# Patient Record
Sex: Male | Born: 1980 | Race: Black or African American | Hispanic: No | Smoking: Current every day smoker
Health system: Southern US, Community
[De-identification: ages and names within clinical notes are randomized; demographics above are authoritative.]

## PROBLEM LIST (undated history)

## (undated) DIAGNOSIS — N200 Calculus of kidney: Secondary | ICD-10-CM

## (undated) DIAGNOSIS — D573 Sickle-cell trait: Secondary | ICD-10-CM

## (undated) HISTORY — DX: Calculus of kidney: N20.0

---

## 2010-03-21 ENCOUNTER — Encounter: Admission: RE | Admit: 2010-03-21 | Discharge: 2010-03-21 | Payer: Self-pay | Admitting: Family Medicine

## 2011-06-05 ENCOUNTER — Ambulatory Visit
Admission: RE | Admit: 2011-06-05 | Discharge: 2011-06-05 | Disposition: A | Discharge status: Home / Self Care | Payer: Self-pay | Source: Ambulatory Visit | Attending: Family Medicine | Admitting: Family Medicine

## 2011-06-05 ENCOUNTER — Other Ambulatory Visit: Payer: Self-pay | Admitting: Family Medicine

## 2011-06-05 DIAGNOSIS — M545 Low back pain: Secondary | ICD-10-CM

## 2013-08-10 ENCOUNTER — Ambulatory Visit (INDEPENDENT_AMBULATORY_CARE_PROVIDER_SITE_OTHER): Payer: BC Managed Care – PPO | Admitting: General Surgery

## 2013-08-16 ENCOUNTER — Encounter (INDEPENDENT_AMBULATORY_CARE_PROVIDER_SITE_OTHER): Payer: Self-pay | Admitting: General Surgery

## 2016-07-12 ENCOUNTER — Other Ambulatory Visit: Payer: Self-pay | Admitting: Orthopedic Surgery

## 2016-07-12 DIAGNOSIS — M419 Scoliosis, unspecified: Secondary | ICD-10-CM

## 2016-07-12 DIAGNOSIS — M546 Pain in thoracic spine: Secondary | ICD-10-CM

## 2016-07-12 DIAGNOSIS — M545 Low back pain: Secondary | ICD-10-CM

## 2019-07-11 DIAGNOSIS — H6121 Impacted cerumen, right ear: Secondary | ICD-10-CM | POA: Diagnosis not present

## 2019-07-11 DIAGNOSIS — Z13220 Encounter for screening for lipoid disorders: Secondary | ICD-10-CM | POA: Diagnosis not present

## 2019-07-11 DIAGNOSIS — Z125 Encounter for screening for malignant neoplasm of prostate: Secondary | ICD-10-CM | POA: Diagnosis not present

## 2019-07-11 DIAGNOSIS — Z0000 Encounter for general adult medical examination without abnormal findings: Secondary | ICD-10-CM | POA: Diagnosis not present

## 2019-09-22 ENCOUNTER — Other Ambulatory Visit: Payer: Self-pay

## 2019-09-22 ENCOUNTER — Emergency Department (HOSPITAL_COMMUNITY)
Admission: EM | Admit: 2019-09-22 | Discharge: 2019-09-23 | Disposition: A | Discharge status: Home / Self Care | Payer: BC Managed Care – PPO | Attending: Emergency Medicine | Admitting: Emergency Medicine

## 2019-09-22 ENCOUNTER — Emergency Department (HOSPITAL_COMMUNITY): Payer: BC Managed Care – PPO

## 2019-09-22 ENCOUNTER — Encounter (HOSPITAL_COMMUNITY): Payer: Self-pay

## 2019-09-22 DIAGNOSIS — Z79899 Other long term (current) drug therapy: Secondary | ICD-10-CM | POA: Insufficient documentation

## 2019-09-22 DIAGNOSIS — R319 Hematuria, unspecified: Secondary | ICD-10-CM | POA: Diagnosis not present

## 2019-09-22 DIAGNOSIS — N23 Unspecified renal colic: Secondary | ICD-10-CM | POA: Diagnosis not present

## 2019-09-22 DIAGNOSIS — I10 Essential (primary) hypertension: Secondary | ICD-10-CM | POA: Diagnosis not present

## 2019-09-22 DIAGNOSIS — R1031 Right lower quadrant pain: Secondary | ICD-10-CM | POA: Diagnosis not present

## 2019-09-22 DIAGNOSIS — F17290 Nicotine dependence, other tobacco product, uncomplicated: Secondary | ICD-10-CM | POA: Diagnosis not present

## 2019-09-22 DIAGNOSIS — R52 Pain, unspecified: Secondary | ICD-10-CM | POA: Diagnosis not present

## 2019-09-22 DIAGNOSIS — R109 Unspecified abdominal pain: Secondary | ICD-10-CM | POA: Diagnosis not present

## 2019-09-22 DIAGNOSIS — R1084 Generalized abdominal pain: Secondary | ICD-10-CM | POA: Diagnosis not present

## 2019-09-22 DIAGNOSIS — I959 Hypotension, unspecified: Secondary | ICD-10-CM | POA: Diagnosis not present

## 2019-09-22 HISTORY — DX: Sickle-cell trait: D57.3

## 2019-09-22 LAB — CBC WITH DIFFERENTIAL/PLATELET
Abs Immature Granulocytes: 0.04 10*3/uL (ref 0.00–0.07)
Basophils Absolute: 0.1 10*3/uL (ref 0.0–0.1)
Basophils Relative: 0 %
Eosinophils Absolute: 0.1 10*3/uL (ref 0.0–0.5)
Eosinophils Relative: 0 %
HCT: 44.9 % (ref 39.0–52.0)
Hemoglobin: 15.1 g/dL (ref 13.0–17.0)
Immature Granulocytes: 0 %
Lymphocytes Relative: 11 %
Lymphs Abs: 1.5 10*3/uL (ref 0.7–4.0)
MCH: 30.3 pg (ref 26.0–34.0)
MCHC: 33.6 g/dL (ref 30.0–36.0)
MCV: 90 fL (ref 80.0–100.0)
Monocytes Absolute: 0.9 10*3/uL (ref 0.1–1.0)
Monocytes Relative: 6 %
Neutro Abs: 11.6 10*3/uL — ABNORMAL HIGH (ref 1.7–7.7)
Neutrophils Relative %: 83 %
Platelets: 267 10*3/uL (ref 150–400)
RBC: 4.99 MIL/uL (ref 4.22–5.81)
RDW: 12.9 % (ref 11.5–15.5)
WBC: 14.1 10*3/uL — ABNORMAL HIGH (ref 4.0–10.5)
nRBC: 0 % (ref 0.0–0.2)

## 2019-09-22 LAB — URINALYSIS, ROUTINE W REFLEX MICROSCOPIC
Bilirubin Urine: NEGATIVE
Glucose, UA: NEGATIVE mg/dL
Ketones, ur: 5 mg/dL — AB
Leukocytes,Ua: NEGATIVE
Nitrite: NEGATIVE
Protein, ur: 30 mg/dL — AB
RBC / HPF: >50 RBC/hpf — ABNORMAL HIGH (ref 0–5)
Specific Gravity, Urine: 1.014 (ref 1.005–1.030)
pH: 6 (ref 5.0–8.0)

## 2019-09-22 LAB — COMPREHENSIVE METABOLIC PANEL
ALT: 23 U/L (ref 0–44)
AST: 23 U/L (ref 15–41)
Albumin: 4.2 g/dL (ref 3.5–5.0)
Alkaline Phosphatase: 79 U/L (ref 38–126)
Anion gap: 7 (ref 5–15)
BUN: 12 mg/dL (ref 6–20)
CO2: 26 mmol/L (ref 22–32)
Calcium: 8.9 mg/dL (ref 8.9–10.3)
Chloride: 105 mmol/L (ref 98–111)
Creatinine, Ser: 1.09 mg/dL (ref 0.61–1.24)
GFR calc Af Amer: >60 mL/min (ref >60)
GFR calc non Af Amer: >60 mL/min (ref >60)
Glucose, Bld: 98 mg/dL (ref 70–99)
Potassium: 4 mmol/L (ref 3.5–5.1)
Sodium: 138 mmol/L (ref 135–145)
Total Bilirubin: 0.4 mg/dL (ref 0.3–1.2)
Total Protein: 7.2 g/dL (ref 6.5–8.1)

## 2019-09-22 NOTE — ED Triage Notes (Signed)
Pt from home with c/o of sudden and severe RL abdominal pain that caused him to vomit.  Pt stated 2 hours earlier he urinated what looked like a blood clot.  Later he tried to urinate, was only able to urinate some, but still had an urge to urinate.  Pt given 200 mcg fentanyl. 18 gauge L AC

## 2019-09-22 NOTE — ED Provider Notes (Signed)
[facility]-EMERGENCY DEPT Provider Note   CSN: [id number] Arrival date & time: 09/22/19  1832     History   Chief Complaint Chief Complaint  Patient presents with   Abdominal Pain   Emesis    HPI Ronald MartyrMichael Mcdonnell Jr. is a 38 y.o. male.     Patient with no past abdominal surgical history presents the emergency department with acute onset of right lower quadrant pain with associated diaphoresis and vomiting starting this afternoon.  Patient denies any chest pain or shortness of breath.  He passed blood in his urine and also noted small clots.  No bowel changes.  No testicular pain or tenderness.  No back pain.  He has never had kidney stones before but has a family history.  He was given fentanyl in route by EMS who transported the patient to the hospital.  No other treatments prior to arrival.  Onset of symptoms acute.  Course is improving.  Pain is now currently 3/10.  Nothing makes symptoms worse.     Past Medical History:  Diagnosis Date   Sickle cell trait (HCC)     There are no active problems to display for this patient.   History reviewed. No pertinent surgical history.      Home Medications    Prior to Admission medications   Medication Sig Start Date End Date Taking? Authorizing Provider  acetaminophen (TYLENOL) 325 MG tablet Take 650 mg by mouth daily as needed for moderate pain or headache.   Yes [provider]  Misc Natural Products (NF FORMULAS TESTOSTERONE) CAPS Take 1 capsule by mouth daily.   Yes [provider]  naproxen (NAPROSYN) 500 MG tablet Take 1 tablet (500 mg total) by mouth 2 (two) times daily. 09/23/19   Renne CriglerGeiple, Dalayza Zambrana, PA-C  oxyCODONE-acetaminophen (PERCOCET/ROXICET) 5-325 MG tablet Take 1 tablet by mouth every 6 (six) hours as needed for severe pain. 09/23/19   Renne CriglerGeiple, Yahmir Sokolov, PA-C    Family History History reviewed. No pertinent family history.  Social History Social History   Tobacco Use    Smoking status: Current Every Day Smoker    Types: Cigars   Tobacco comment: 1 cigar  Substance Use Topics   Alcohol use: Yes    Comment: 1x/month socially   Drug use: Not on file     Allergies   Patient has no known allergies.   Review of Systems Review of Systems  Constitutional: Positive for diaphoresis. Negative for fever.  HENT: Negative for rhinorrhea and sore throat.   Eyes: Negative for redness.  Respiratory: Negative for cough and shortness of breath.   Cardiovascular: Negative for chest pain.  Gastrointestinal: Positive for abdominal pain, nausea and vomiting. Negative for diarrhea.  Genitourinary: Positive for hematuria. Negative for dysuria, scrotal swelling and testicular pain.  Musculoskeletal: Negative for myalgias.  Skin: Negative for rash.  Neurological: Negative for headaches.     Physical Exam Updated Vital Signs BP 117/88    Pulse 60    Temp 97.6 F (36.4 C) (Oral)    Resp 16    Ht 5\' 11"  (1.803 m)    Wt 99.8 kg    SpO2 100%    BMI 30.68 kg/m   Physical Exam Vitals signs and nursing note reviewed.  Constitutional:      Appearance: He is well-developed.  HENT:     Head: Normocephalic and atraumatic.  Eyes:     General:        Right eye: No discharge.  Left eye: No discharge.     Conjunctiva/sclera: Conjunctivae normal.  Neck:     Musculoskeletal: Normal range of motion and neck supple.  Cardiovascular:     Rate and Rhythm: Normal rate and regular rhythm.     Heart sounds: Normal heart sounds.  Pulmonary:     Effort: Pulmonary effort is normal.     Breath sounds: Normal breath sounds.  Abdominal:     Palpations: Abdomen is soft.     Tenderness: There is abdominal tenderness (Mild) in the right lower quadrant. There is no guarding or rebound.     Hernia: No hernia is present.  Genitourinary:    Scrotum/Testes: Normal.        Right: Mass, tenderness or swelling not present.        Left: Mass, tenderness or swelling not present.   Skin:    General: Skin is warm and dry.  Neurological:     Mental Status: He is alert.      ED Treatments / Results  Labs (all labs ordered are listed, but only abnormal results are displayed) Labs Reviewed  CBC WITH DIFFERENTIAL/PLATELET - Abnormal; Notable for the following components:      Result Value   WBC 14.1 (*)    Neutro Abs 11.6 (*)    All other components within normal limits  URINALYSIS, ROUTINE W REFLEX MICROSCOPIC - Abnormal; Notable for the following components:   Color, Urine AMBER (*)    Hgb urine dipstick LARGE (*)    Ketones, ur 5 (*)    Protein, ur 30 (*)    RBC / HPF >50 (*)    Bacteria, UA RARE (*)    All other components within normal limits  COMPREHENSIVE METABOLIC PANEL    EKG None  Radiology Ct Renal Stone Study  Result Date: 09/22/2019 CLINICAL DATA:  38 year old male with right abdominal pain. EXAM: CT ABDOMEN AND PELVIS WITHOUT CONTRAST TECHNIQUE: Multidetector CT imaging of the abdomen and pelvis was performed following the standard protocol without IV contrast. COMPARISON:  None. FINDINGS: Evaluation of this exam is limited in the absence of intravenous contrast. Lower chest: 2 adjacent nodules in the left lower lobe measure up to 5 mm (series 4 image 5). The visualized lung bases are otherwise clear. No intra-abdominal free air or free fluid. Hepatobiliary: No focal liver abnormality is seen. No gallstones, gallbladder wall thickening, or biliary dilatation. Pancreas: Unremarkable. No pancreatic ductal dilatation or surrounding inflammatory changes. Spleen: Normal in size without focal abnormality. Adrenals/Urinary Tract: The adrenal glands are unremarkable. There is no hydronephrosis or nephrolithiasis on either side. There is mild fullness of the right renal pelvis and proximal ureter with adjacent stranding. Findings may be related to a recently passed right renal calculus or right-sided pyelonephritis. Correlation with urinalysis recommended.  The urinary bladder is unremarkable. Stomach/Bowel: There is no bowel obstruction or active inflammation. The appendix is normal. Vascular/Lymphatic: The abdominal aorta and IVC are grossly unremarkable on this noncontrast CT. No portal venous gas. There is no adenopathy. Reproductive: The prostate and seminal vesicles are grossly unremarkable. No pelvic mass. Other: Small fat containing umbilical hernia. Musculoskeletal: Severe scoliosis. No acute osseous pathology. IMPRESSION: 1. Mild fullness of the right renal pelvis and proximal ureter with adjacent stranding. Findings may be related to a recently passed right renal calculus or right-sided pyelonephritis. Correlation with urinalysis recommended. No hydronephrosis or nephrolithiasis. 2. No bowel obstruction or active inflammation. Normal appendix. Electronically Signed   By: Anner Crete M.D.   On:  09/22/2019 20:12    Procedures Procedures (including critical care time)  Medications Ordered in ED Medications - No data to display   Initial Impression / Assessment and Plan / ED Course  I have reviewed the triage vital signs and the nursing notes.  Pertinent labs & imaging results that were available during my care of the patient were reviewed by me and considered in my medical decision making (see chart for details).        Patient seen and examined. Work-up initiated. Medications ordered.  Suspect patient has ureteral colic.  Will obtain CT renal to confirm stone as patient has never had one before.  Pain is currently controlled.  Vital signs reviewed and are as follows: BP 117/88    Pulse 60    Temp 97.6 F (36.4 C) (Oral)    Resp 16    Ht 5\' 11"  (1.803 m)    Wt 99.8 kg    SpO2 100%    BMI 30.68 kg/m   12:15 AM CT performed and reviewed.  Note definitive stone noted however there were changes on the right side consistent with recently passed stone.  Pain is remained controlled during ED stay.  UA is clear without signs of infection  but does show blood.  Normal kidney function.  At this time, patient is stable for discharged home.  Patient will be provided with strainer and given prescription for oxycodone and naproxen.  Urology follow-up given.  Patient counseled on kidney stone treatment. Urged patient to strain urine and save any stones. Urged urology follow-up and return to [facility] with any complications. Counseled patient to maintain good fluid intake.   Patient counseled on use of narcotic pain medications. Counseled not to combine these medications with others containing tylenol. Urged not to drink alcohol, drive, or perform any other activities that requires focus while taking these medications. The patient verbalizes understanding and agrees with the plan.   Final Clinical Impressions(s) / ED Diagnoses   Final diagnoses:  Ureteral colic  Hematuria, unspecified type   Patient with right-sided flank pain, hematuria, acute onset consistent with ureteral colic.  Work-up as above.  Labs are reassuring.  Pain is controlled.  ED Discharge Orders         Ordered    oxyCODONE-acetaminophen (PERCOCET/ROXICET) 5-325 MG tablet  Every 6 hours PRN,   Status:  Discontinued     09/23/19 0006    naproxen (NAPROSYN) 500 MG tablet  2 times daily     09/23/19 0006    oxyCODONE-acetaminophen (PERCOCET/ROXICET) 5-325 MG tablet  Every 6 hours PRN     09/23/19 0007           11/23/19, PA-C 09/23/19 0016    11/23/19, MD 09/23/19 1609

## 2019-09-23 MED ORDER — NAPROXEN 500 MG PO TABS: 500.0000 mg | ORAL_TABLET | Freq: Two times a day (BID) | ORAL | 0 refills | Status: DC

## 2019-09-23 MED ORDER — OXYCODONE-ACETAMINOPHEN 5-325 MG PO TABS: 1.0000 | ORAL_TABLET | Freq: Four times a day (QID) | ORAL | 0 refills | Status: DC | PRN

## 2019-09-23 NOTE — Discharge Instructions (Signed)
Please read and follow all provided instructions.  Your diagnoses today include:  1. Ureteral colic   2. Hematuria, unspecified type     Tests performed today include:  Urine test that showed blood in your urine and no infection  CT scan which did not show a definite kidney stone but shows signs of a recently passed kidney stone  Blood test that showed normal kidney function  Vital signs. See below for your results today.   Medications prescribed:   Percocet (oxycodone/acetaminophen) - narcotic pain medication  DO NOT drive or perform any activities that require you to be awake and alert because this medicine can make you drowsy. BE VERY CAREFUL not to take multiple medicines containing Tylenol (also called acetaminophen). Doing so can lead to an overdose which can damage your liver and cause liver failure and possibly death.   Naproxen - anti-inflammatory pain medication  Do not exceed 500mg  naproxen every 12 hours, take with food  You have been prescribed an anti-inflammatory medication or NSAID. Take with food. Take smallest effective dose for the shortest duration needed for your pain. Stop taking if you experience stomach pain or vomiting.   Take any prescribed medications only as directed.  Home care instructions:  Follow any educational materials contained in this packet.  Please double your fluid intake for the next several days. Strain your urine and save any stones that may pass.   BE VERY CAREFUL not to take multiple medicines containing Tylenol (also called acetaminophen). Doing so can lead to an overdose which can damage your liver and cause liver failure and possibly death.   Follow-up instructions: Please follow-up with your urologist or the urologist referral (provided on front page) in the next 1 week for further evaluation of your symptoms.  If you need to return to the Emergency Department, go to [facility] and not [facility]. The  urologists are located at [facility] and can better care for you at this location.  Return instructions:   Please return to the Emergency Department if you experience worsening symptoms.  Please return if you develop fever or uncontrolled pain or vomiting.  Please return if you have any other emergent concerns.  Additional Information:  Your vital signs today were: BP 113/77 (BP Location: Right Arm)    Pulse 60    Temp 97.8 F (36.6 C) (Oral)    Resp 16    Ht 5\' 11"  (1.803 m)    Wt 99.8 kg    SpO2 100%    BMI 30.68 kg/m  If your blood pressure (BP) was elevated above 135/85 this visit, please have this repeated by your doctor within one month. --------------

## 2019-10-03 IMAGING — CT CT RENAL STONE PROTOCOL
2 of 4 series · 16 of 46 positions shown, 18 images · non-contrast
Comparison: None.

CLINICAL DATA: 38-year-old male with right abdominal pain.

EXAM:
CT ABDOMEN AND PELVIS WITHOUT CONTRAST
TECHNIQUE: Multidetector CT imaging of the abdomen and pelvis was performed
following the standard protocol without IV contrast.

[Series 2: axial st · axial · 0.75mm/px · z∈[-344,+76]mm · 13 of 94 slices shown, 15 images]
[im 5/94  soft-tissue]
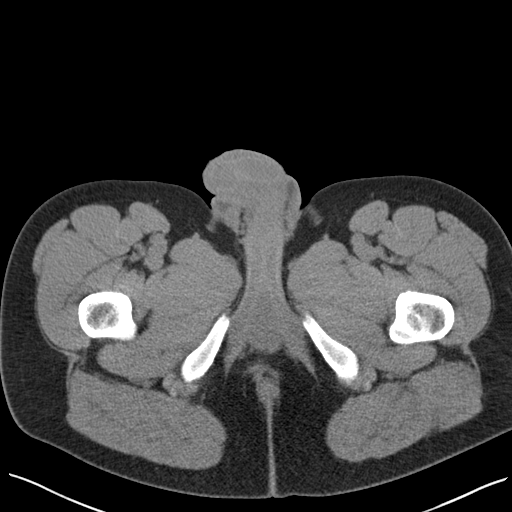
[im 5/94  bone]
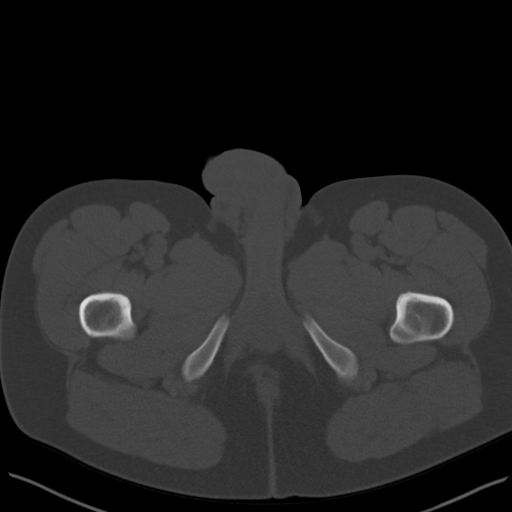
[im 15/94  soft-tissue]
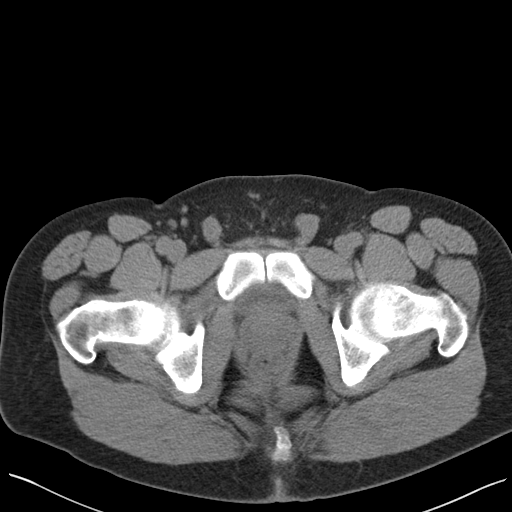
[im 20/94  soft-tissue]
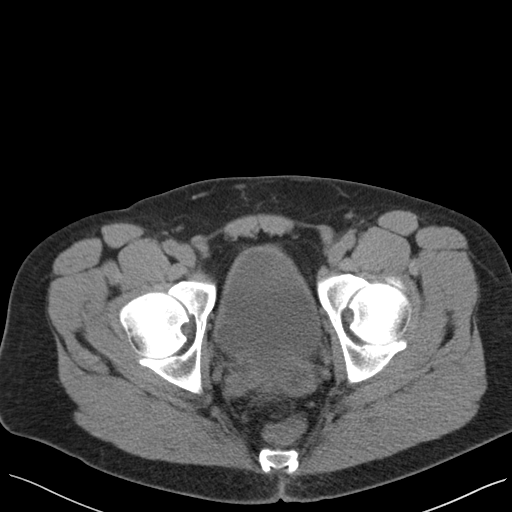
[im 25/94  soft-tissue]
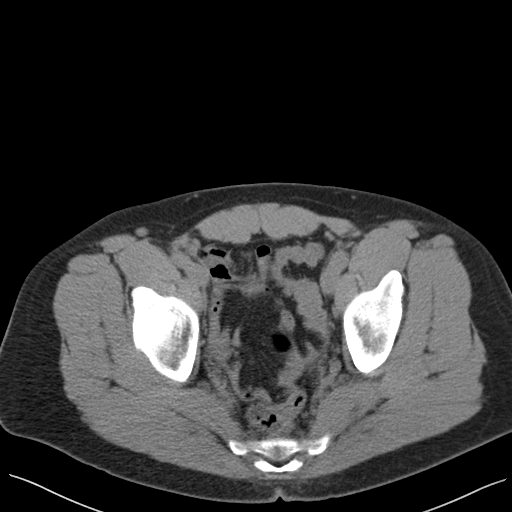
[im 35/94  soft-tissue]
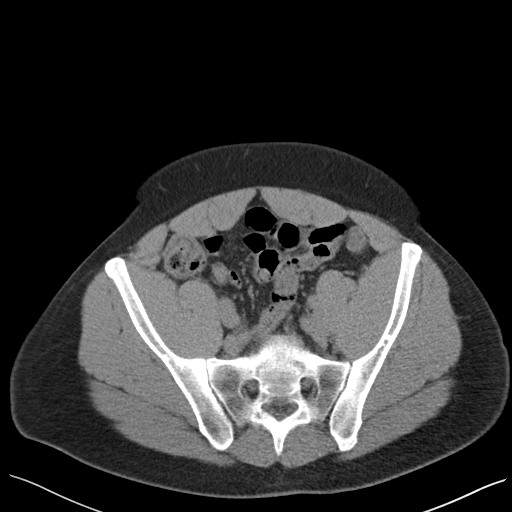
[im 40/94  soft-tissue]
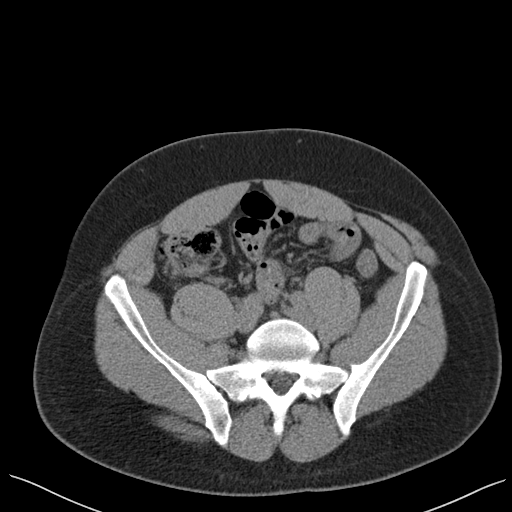
[im 49/94  soft-tissue]
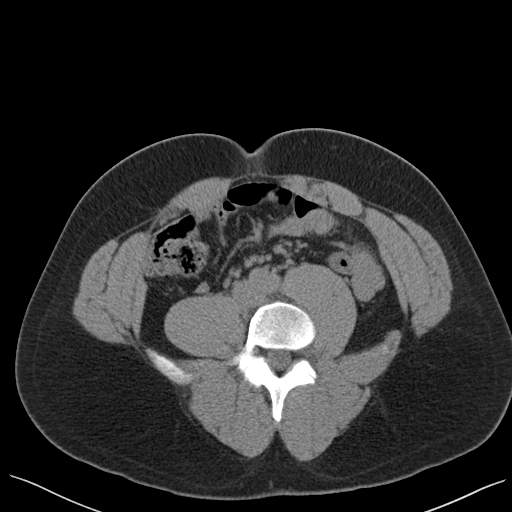
[im 54/94  soft-tissue]
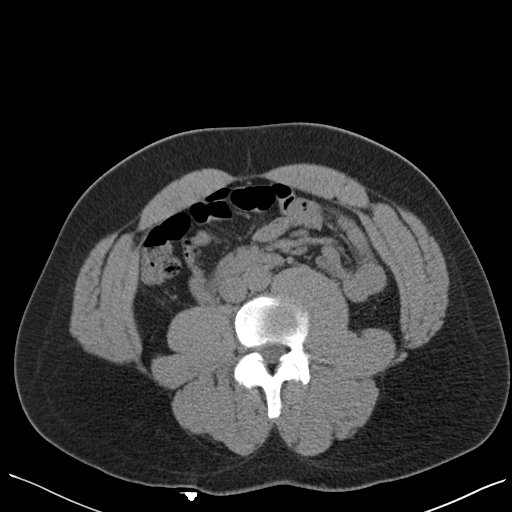
[im 59/94  soft-tissue]
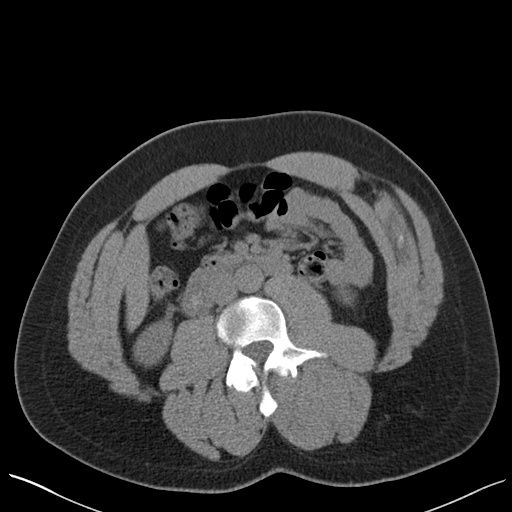
[im 59/94  bone]
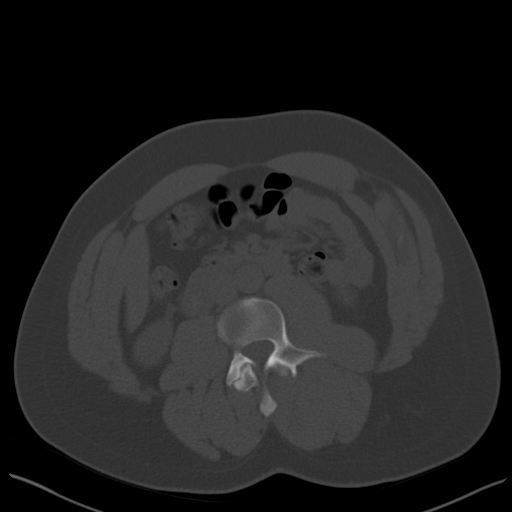
[im 69/94  soft-tissue]
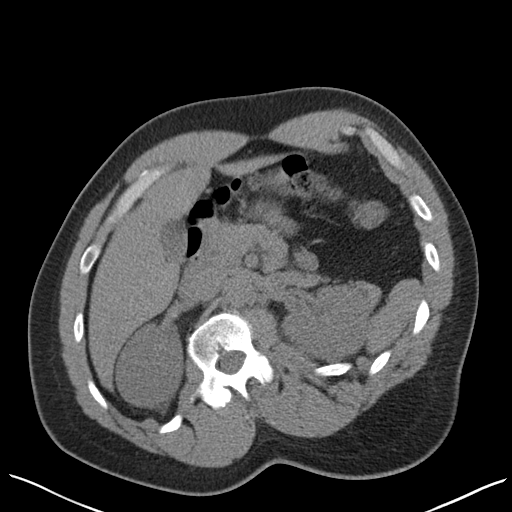
[im 74/94  soft-tissue]
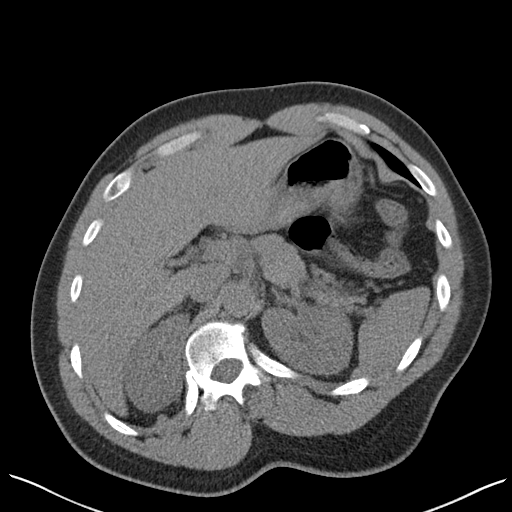
[im 79/94  soft-tissue]
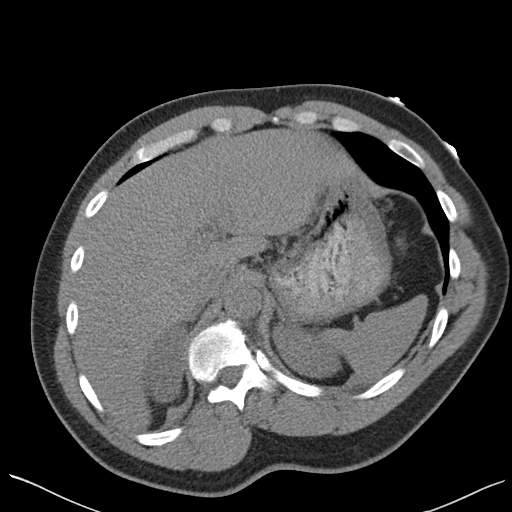
[im 89/94  soft-tissue]
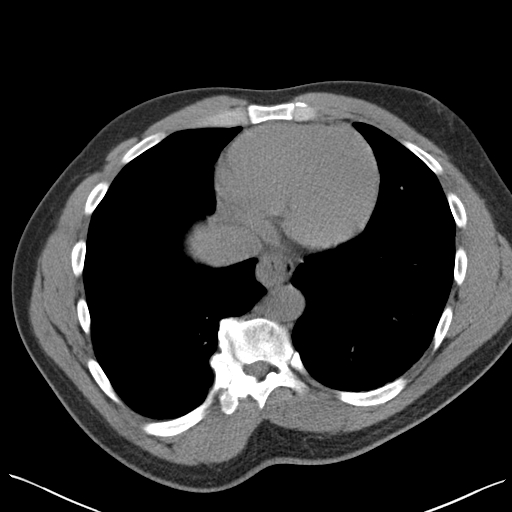

[Series 5: coronal · coronal · 0.78mm/px · 3 of 151 slices shown]
[im 51/151  soft-tissue]
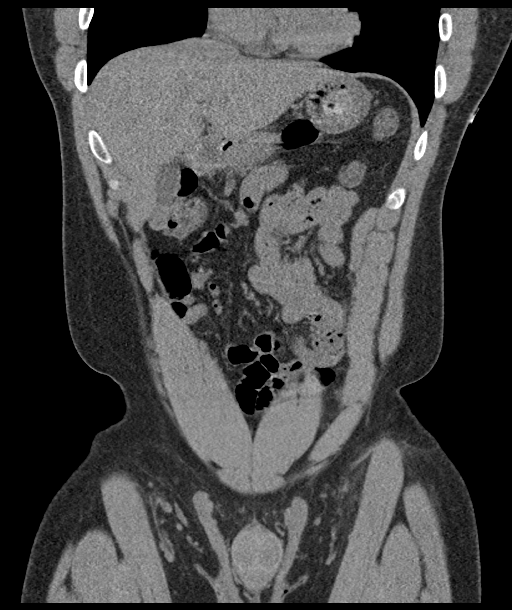
[im 67/151  soft-tissue]
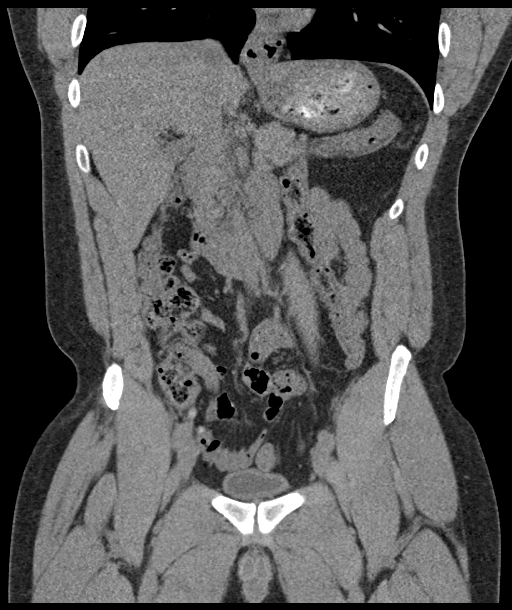
[im 84/151  soft-tissue]
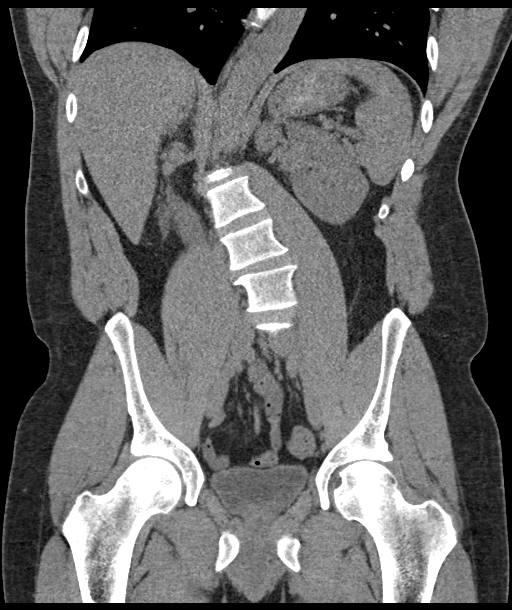

[16 of 46 positions shown; findings below may reference images not displayed]

FINDINGS: Evaluation of this exam is limited in the absence of intravenous
contrast.

Lower chest: 2 adjacent nodules in the left lower lobe measure up to
5 mm (series 4 image 5). The visualized lung bases are otherwise
clear.

No intra-abdominal free air or free fluid.

Hepatobiliary: No focal liver abnormality is seen. No gallstones,
gallbladder wall thickening, or biliary dilatation.

Pancreas: Unremarkable. No pancreatic ductal dilatation or
surrounding inflammatory changes.

Spleen: Normal in size without focal abnormality.

Adrenals/Urinary Tract: The adrenal glands are unremarkable. There
is no hydronephrosis or nephrolithiasis on either side. There is
mild fullness of the right renal pelvis and proximal ureter with
adjacent stranding. Findings may be related to a recently passed
right renal calculus or right-sided pyelonephritis. Correlation with
urinalysis recommended. The urinary bladder is unremarkable.

Stomach/Bowel: There is no bowel obstruction or active inflammation.
The appendix is normal.

Vascular/Lymphatic: The abdominal aorta and IVC are grossly
unremarkable on this noncontrast CT. No portal venous gas. There is
no adenopathy.

Reproductive: The prostate and seminal vesicles are grossly
unremarkable. No pelvic mass.

Other: Small fat containing umbilical hernia.

Musculoskeletal: Severe scoliosis. No acute osseous pathology.
IMPRESSION: 1. Mild fullness of the right renal pelvis and proximal ureter with
adjacent stranding. Findings may be related to a recently passed
right renal calculus or right-sided pyelonephritis. Correlation with
urinalysis recommended. No hydronephrosis or nephrolithiasis.
2. No bowel obstruction or active inflammation. Normal appendix.

## 2019-10-04 DIAGNOSIS — R1084 Generalized abdominal pain: Secondary | ICD-10-CM | POA: Diagnosis not present

## 2020-03-08 ENCOUNTER — Other Ambulatory Visit: Payer: Self-pay

## 2020-03-08 ENCOUNTER — Encounter: Payer: Self-pay | Admitting: Urology

## 2020-03-08 ENCOUNTER — Ambulatory Visit (INDEPENDENT_AMBULATORY_CARE_PROVIDER_SITE_OTHER): Payer: No Typology Code available for payment source | Admitting: Urology

## 2020-03-08 VITALS — BP 113/78 | HR 59 | Ht 71.0 in | Wt 235.0 lb

## 2020-03-08 DIAGNOSIS — R310 Gross hematuria: Secondary | ICD-10-CM

## 2020-03-08 LAB — URINALYSIS, COMPLETE
Bilirubin, UA: NEGATIVE
Glucose, UA: NEGATIVE
Ketones, UA: NEGATIVE
Leukocytes,UA: NEGATIVE
Nitrite, UA: NEGATIVE
Protein,UA: NEGATIVE
RBC, UA: NEGATIVE
Specific Gravity, UA: 1.02 (ref 1.005–1.030)
Urobilinogen, Ur: 0.2 mg/dL (ref 0.2–1.0)
pH, UA: 7 (ref 5.0–7.5)

## 2020-03-08 LAB — MICROSCOPIC EXAMINATION
Bacteria, UA: NONE SEEN
RBC: NONE SEEN /hpf (ref 0–2)

## 2020-03-08 MED ORDER — LIDOCAINE HCL URETHRAL/MUCOSAL 2 % EX GEL
1.0000 "application " | Freq: Once | CUTANEOUS | Status: AC
  Administered 2020-03-08: 1 via URETHRAL

## 2020-03-08 MED ORDER — LIDOCAINE HCL URETHRAL/MUCOSAL 2 % EX GEL: 1.0000 "application " | Freq: Once | CUTANEOUS | Status: DC

## 2020-03-08 NOTE — Progress Notes (Signed)
Cystoscopy Procedure Note:  Indication: Gross hematuria  Briefly, he is a healthy 39 year old male who was worked up at the [place] for gross hematuria, and referred to me for cystoscopy to complete hematuria work-up.  He had a first episode of hematuria and flank pain in October 2020 at which point it was thought that he had spontaneously passed the stone.  There is no residual nephrolithiasis on that CT.  He had a recurrence of pink to medium red urine in January for a few weeks that was asymptomatic, and he then underwent a CT urogram locally at the [place] in February 2021.  I am unable to personally review this imaging, however he has the printed report with him that is reportedly normal with no filling defects, stones, or hydronephrosis.  He denies any hematuria over the last 1 to 2 months.  He denies any voiding complaints or flank pain.  After informed consent and discussion of the procedure and its risks, Ronald Mccall. was positioned and prepped in the standard fashion. Cystoscopy was performed with a flexible cystoscope. The urethra, bladder neck and entire bladder was visualized in a standard fashion. The prostate was small to moderate in size. The ureteral orifices were visualized in their normal location and orientation.  Both ureteral orifices effluxed clear yellow urine.  The bladder mucosa was grossly normal throughout.  On retroflexion, there was some subtly dilated blood vessels at the prostate, but no concerning findings.  Findings: Cystoscopy, subtle dilated prostatic blood vessels likely source of hematuria  Assessment and Plan: Follow-up locally at the [facility], MD 03/08/2020

## 2020-11-03 ENCOUNTER — Ambulatory Visit: Payer: BC Managed Care – PPO

## 2020-11-03 ENCOUNTER — Ambulatory Visit: Payer: BC Managed Care – PPO | Attending: Internal Medicine

## 2020-11-03 DIAGNOSIS — Z23 Encounter for immunization: Secondary | ICD-10-CM

## 2020-11-03 NOTE — Progress Notes (Signed)
   [facility]  Name:  Ronald Mccall.    MRN: [id number] DOB: [birth year]  11/03/2020  Ronald Mccall was observed post Covid-19 immunization for 15 minutes without incident. He was provided with Vaccine Information Sheet and instruction to access the V-Safe system.   Ronald Mccall was instructed to call 911 with any severe reactions post vaccine: Marland Kitchen Difficulty breathing  . Swelling of face and throat  . A fast heartbeat  . A bad rash all over body  . Dizziness and weakness   Immunizations Administered    No immunizations on file.

## 2020-11-06 DIAGNOSIS — M419 Scoliosis, unspecified: Secondary | ICD-10-CM | POA: Diagnosis not present

## 2020-11-06 DIAGNOSIS — M546 Pain in thoracic spine: Secondary | ICD-10-CM | POA: Diagnosis not present

## 2020-11-06 DIAGNOSIS — M549 Dorsalgia, unspecified: Secondary | ICD-10-CM | POA: Diagnosis not present

## 2020-11-19 DIAGNOSIS — M9902 Segmental and somatic dysfunction of thoracic region: Secondary | ICD-10-CM | POA: Diagnosis not present

## 2020-11-19 DIAGNOSIS — M5416 Radiculopathy, lumbar region: Secondary | ICD-10-CM | POA: Diagnosis not present

## 2020-11-19 DIAGNOSIS — M9903 Segmental and somatic dysfunction of lumbar region: Secondary | ICD-10-CM | POA: Diagnosis not present

## 2020-11-19 DIAGNOSIS — M5136 Other intervertebral disc degeneration, lumbar region: Secondary | ICD-10-CM | POA: Diagnosis not present

## 2020-11-20 DIAGNOSIS — M9902 Segmental and somatic dysfunction of thoracic region: Secondary | ICD-10-CM | POA: Diagnosis not present

## 2020-11-20 DIAGNOSIS — M9903 Segmental and somatic dysfunction of lumbar region: Secondary | ICD-10-CM | POA: Diagnosis not present

## 2020-11-20 DIAGNOSIS — M5416 Radiculopathy, lumbar region: Secondary | ICD-10-CM | POA: Diagnosis not present

## 2020-11-20 DIAGNOSIS — M5136 Other intervertebral disc degeneration, lumbar region: Secondary | ICD-10-CM | POA: Diagnosis not present

## 2020-11-22 DIAGNOSIS — M9903 Segmental and somatic dysfunction of lumbar region: Secondary | ICD-10-CM | POA: Diagnosis not present

## 2020-11-22 DIAGNOSIS — M5136 Other intervertebral disc degeneration, lumbar region: Secondary | ICD-10-CM | POA: Diagnosis not present

## 2020-11-22 DIAGNOSIS — M9902 Segmental and somatic dysfunction of thoracic region: Secondary | ICD-10-CM | POA: Diagnosis not present

## 2020-11-22 DIAGNOSIS — M5416 Radiculopathy, lumbar region: Secondary | ICD-10-CM | POA: Diagnosis not present

## 2024-04-19 ENCOUNTER — Emergency Department
Admission: EM | Admit: 2024-04-19 | Discharge: 2024-04-20 | Disposition: A | Discharge status: Home / Self Care | Attending: Emergency Medicine | Admitting: Emergency Medicine

## 2024-04-19 ENCOUNTER — Other Ambulatory Visit: Payer: Self-pay

## 2024-04-19 ENCOUNTER — Encounter: Payer: Self-pay | Admitting: Emergency Medicine

## 2024-04-19 DIAGNOSIS — R739 Hyperglycemia, unspecified: Secondary | ICD-10-CM | POA: Diagnosis present

## 2024-04-19 DIAGNOSIS — E1165 Type 2 diabetes mellitus with hyperglycemia: Secondary | ICD-10-CM | POA: Insufficient documentation

## 2024-04-19 LAB — URINALYSIS, ROUTINE W REFLEX MICROSCOPIC
Bacteria, UA: NONE SEEN
Bilirubin Urine: NEGATIVE
Glucose, UA: >=500 mg/dL — AB
Hgb urine dipstick: NEGATIVE
Ketones, ur: NEGATIVE mg/dL
Leukocytes,Ua: NEGATIVE
Nitrite: NEGATIVE
Protein, ur: NEGATIVE mg/dL
Specific Gravity, Urine: 1.027 (ref 1.005–1.030)
Squamous Epithelial / HPF: 0 /HPF (ref 0–5)
pH: 5 (ref 5.0–8.0)

## 2024-04-19 LAB — BASIC METABOLIC PANEL WITH GFR
Anion gap: 15 (ref 5–15)
BUN: 21 mg/dL — ABNORMAL HIGH (ref 6–20)
CO2: 19 mmol/L — ABNORMAL LOW (ref 22–32)
Calcium: 9.7 mg/dL (ref 8.9–10.3)
Chloride: 91 mmol/L — ABNORMAL LOW (ref 98–111)
Creatinine, Ser: 1.6 mg/dL — ABNORMAL HIGH (ref 0.61–1.24)
GFR, Estimated: 54 mL/min — ABNORMAL LOW (ref >60)
Glucose, Bld: 704 mg/dL (ref 70–99)
Potassium: 4.1 mmol/L (ref 3.5–5.1)
Sodium: 125 mmol/L — ABNORMAL LOW (ref 135–145)

## 2024-04-19 LAB — CBC
HCT: 42.4 % (ref 39.0–52.0)
Hemoglobin: 15.1 g/dL (ref 13.0–17.0)
MCH: 28.3 pg (ref 26.0–34.0)
MCHC: 35.6 g/dL (ref 30.0–36.0)
MCV: 79.5 fL — ABNORMAL LOW (ref 80.0–100.0)
Platelets: 249 10*3/uL (ref 150–400)
RBC: 5.33 MIL/uL (ref 4.22–5.81)
RDW: 12.7 % (ref 11.5–15.5)
WBC: 6.2 10*3/uL (ref 4.0–10.5)
nRBC: 0 % (ref 0.0–0.2)

## 2024-04-19 LAB — BLOOD GAS, VENOUS
Acid-base deficit: 2.2 mmol/L — ABNORMAL HIGH (ref 0.0–2.0)
Bicarbonate: 22.4 mmol/L (ref 20.0–28.0)
O2 Saturation: 96.4 %
Patient temperature: 37
pCO2, Ven: 37 mmHg — ABNORMAL LOW (ref 44–60)
pH, Ven: 7.39 (ref 7.25–7.43)
pO2, Ven: 73 mmHg — ABNORMAL HIGH (ref 32–45)

## 2024-04-19 LAB — CBG MONITORING, ED
Glucose-Capillary: 461 mg/dL — ABNORMAL HIGH (ref 70–99)
Glucose-Capillary: >600 mg/dL (ref 70–99)

## 2024-04-19 LAB — BETA-HYDROXYBUTYRIC ACID: Beta-Hydroxybutyric Acid: 0.52 mmol/L — ABNORMAL HIGH (ref 0.05–0.27)

## 2024-04-19 MED ORDER — SODIUM CHLORIDE 0.9 % IV BOLUS
2000.0000 mL | Freq: Once | INTRAVENOUS | Status: AC
  Administered 2024-04-19: 2000 mL via INTRAVENOUS

## 2024-04-19 MED ORDER — METFORMIN HCL 500 MG PO TABS: 500.0000 mg | ORAL_TABLET | Freq: Two times a day (BID) | ORAL | 1 refills | Status: AC

## 2024-04-19 MED ORDER — METFORMIN HCL 500 MG PO TABS
500.0000 mg | ORAL_TABLET | Freq: Once | ORAL | Status: AC
Start: 2024-04-19 — End: 2024-04-19
  Administered 2024-04-19: 500 mg via ORAL
  Filled 2024-04-19: qty 1

## 2024-04-19 MED ORDER — INSULIN ASPART 100 UNIT/ML IJ SOLN
10.0000 [IU] | Freq: Once | INTRAMUSCULAR | Status: AC
  Administered 2024-04-19: 10 [IU] via INTRAVENOUS
  Filled 2024-04-19: qty 1

## 2024-04-19 NOTE — ED Provider Notes (Signed)
 [facility] Provider Note    Event Date/Time   First MD Initiated Contact with Patient 04/19/24 2147     (approximate)   History   Chief Complaint: Hyperglycemia   HPI  Ronald Mccall. is a 43 y.o. male with a past history of kidney stones, type 2 diabetes diet controlled who comes to the ED complaining of polyuria, polydipsia for the past 5 days.  Also losing weight during this time.  Reports that he was diagnosed with type 2 diabetes at the [place], but they did not start him on any medication and recommended diet modification.  Denies fever or recent illness, no pain.  No dizziness.        Past Medical History:  Diagnosis Date   Kidney stones    Sickle cell trait [facility])     Current Outpatient Rx   Order #: [id number] Class: Normal    History reviewed. No pertinent surgical history.  Physical Exam   Triage Vital Signs: ED Triage Vitals  Encounter Vitals Group     BP 04/19/24 2116 (!) 146/90     Systolic BP Percentile --      Diastolic BP Percentile --      Pulse Rate 04/19/24 2116 90     Resp 04/19/24 2116 18     Temp 04/19/24 2116 98.6 F (37 C)     Temp Source 04/19/24 2116 Oral     SpO2 04/19/24 2116 97 %     Weight 04/19/24 2117 252 lb (114.3 kg)     Height 04/19/24 2117 5\' 11"  (1.803 m)     Head Circumference --      Peak Flow --      Pain Score 04/19/24 2117 0     Pain Loc --      Pain Education --      Exclude from Growth Chart --     Most recent vital signs: Vitals:   04/19/24 2229 04/19/24 2230  BP:  (!) 113/90  Pulse: 80 80  Resp:  18  Temp:    SpO2: 96% 95%    General: Awake, no distress.   CV:  Good peripheral perfusion.  Regular rate and rhythm Resp:  Normal effort.  Clear to auscultation bilaterally Abd:  No distention.  Soft nontender    ED Results / Procedures / Treatments   Labs (all labs ordered are listed, but only abnormal results are displayed) Labs Reviewed  BASIC METABOLIC PANEL WITH GFR -  Abnormal; Notable for the following components:      Result Value   Sodium 125 (*)    Chloride 91 (*)    CO2 19 (*)    Glucose, Bld 704 (*)    BUN 21 (*)    Creatinine, Ser 1.60 (*)    GFR, Estimated 54 (*)    All other components within normal limits  CBC - Abnormal; Notable for the following components:   MCV 79.5 (*)    All other components within normal limits  URINALYSIS, ROUTINE W REFLEX MICROSCOPIC - Abnormal; Notable for the following components:   Color, Urine STRAW (*)    APPearance CLEAR (*)    Glucose, UA >=500 (*)    All other components within normal limits  BLOOD GAS, VENOUS - Abnormal; Notable for the following components:   pCO2, Ven 37 (*)    pO2, Ven 73 (*)    Acid-base deficit 2.2 (*)    All other components within normal limits  BETA-HYDROXYBUTYRIC ACID -  Abnormal; Notable for the following components:   Beta-Hydroxybutyric Acid 0.52 (*)    All other components within normal limits  CBG MONITORING, ED - Abnormal; Notable for the following components:   Glucose-Capillary >600 (*)    All other components within normal limits  CBG MONITORING, ED  CBG MONITORING, ED     EKG    RADIOLOGY    PROCEDURES:  Procedures   MEDICATIONS ORDERED IN ED: Medications  sodium chloride 0.9 % bolus 2,000 mL (2,000 mLs Intravenous New Bag/Given 04/19/24 2155)  insulin aspart (novoLOG) injection 10 Units (10 Units Intravenous Given 04/19/24 2218)     IMPRESSION / MDM / ASSESSMENT AND PLAN / ED COURSE  I reviewed the triage vital signs and the nursing notes.  DDx: Hyperglycemia, DKA, electrolyte derangement, dehydration  Patient's presentation is most consistent with acute presentation with potential threat to life or bodily function.  Patient presents with home glucometer reading high.  In the ED blood sugar is 700.  He is nontoxic, well-appearing with symptoms limited to polyuria polydipsia.  Lab panel shows normal anion gap, no ketosis in the urine.   Slightly elevated beta hydroxybutyrate.  Not consistent with DKA.  Will give 10 units IV aspart, 2 L saline bolus for hydration, and recheck CBG.  If improved, can start metformin and follow-up.  Prescription sent to his pharmacy.       FINAL CLINICAL IMPRESSION(S) / ED DIAGNOSES   Final diagnoses:  Type 2 diabetes mellitus with hyperglycemia, without long-term current use of insulin (HCC)     Rx / DC Orders   ED Discharge Orders          Ordered    metFORMIN (GLUCOPHAGE) 500 MG tablet  2 times daily with meals        04/19/24 2256             Note:  This document was prepared using Dragon voice recognition software and may include unintentional dictation errors.   Jacquie Maudlin, MD 04/19/24 2300

## 2024-04-19 NOTE — ED Triage Notes (Addendum)
  Patient comes in with hyperglycemia that he first noticed today.  Patient states for the past 5 days he has had polyuria, polydispia, and polyphagia.  Has noticed some weight loss and states he does not feel right.  States he was diagnosed with type 2 diabetes but was not placed on medication.  Denies any pain.   CBG reading high in triage.   Patient states he has had 10+ lb weight loss since symptoms started.

## 2024-04-19 NOTE — Discharge Instructions (Addendum)
 Your lab test today were reassuring.  Continue monitoring your blood sugar, and start taking metformin twice a day.  Please follow-up with your primary care doctor for continued monitoring of your symptoms.
